# Patient Record
Sex: Male | Born: 2000 | Race: White | Hispanic: No | Marital: Single | Smoking: Never smoker
Health system: Southern US, Community
[De-identification: ages and names within clinical notes are randomized; demographics above are authoritative.]

## PROBLEM LIST (undated history)

## (undated) DIAGNOSIS — R569 Unspecified convulsions: Secondary | ICD-10-CM

## (undated) DIAGNOSIS — J329 Chronic sinusitis, unspecified: Secondary | ICD-10-CM

## (undated) DIAGNOSIS — Q9351 Angelman syndrome: Secondary | ICD-10-CM

## (undated) HISTORY — PX: TESTICLE SURGERY: SHX794

---
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
## 2022-08-30 DIAGNOSIS — G40319 Generalized idiopathic epilepsy and epileptic syndromes, intractable, without status epilepticus: Secondary | ICD-10-CM | POA: Insufficient documentation

## 2023-06-05 ENCOUNTER — Other Ambulatory Visit: Payer: Self-pay | Admitting: Pediatrics

## 2023-06-05 ENCOUNTER — Encounter: Payer: Self-pay | Admitting: Pediatrics

## 2023-06-05 DIAGNOSIS — F78A9 Other genetic related intellectual disability: Secondary | ICD-10-CM

## 2023-06-05 DIAGNOSIS — Z1589 Genetic susceptibility to other disease: Secondary | ICD-10-CM

## 2024-02-05 ENCOUNTER — Ambulatory Visit
Admission: EM | Admit: 2024-02-05 | Discharge: 2024-02-05 | Disposition: A | Discharge status: Home / Self Care | Payer: MEDICAID | Attending: Nurse Practitioner | Admitting: Nurse Practitioner

## 2024-02-05 ENCOUNTER — Encounter: Payer: Self-pay | Admitting: Emergency Medicine

## 2024-02-05 ENCOUNTER — Ambulatory Visit (INDEPENDENT_AMBULATORY_CARE_PROVIDER_SITE_OTHER): Payer: MEDICAID | Admitting: Radiology

## 2024-02-05 ENCOUNTER — Telehealth: Payer: Self-pay | Admitting: Emergency Medicine

## 2024-02-05 DIAGNOSIS — M79675 Pain in left toe(s): Secondary | ICD-10-CM

## 2024-02-05 NOTE — ED Triage Notes (Signed)
 Pt is disabled and presents with mother who states when he got back from school yesterday she notice his left was bruised and red.

## 2024-02-05 NOTE — ED Provider Notes (Signed)
 [facility]    CSN: [id number] Arrival date & time: 02/05/24  0955      History   Chief Complaint Chief Complaint  Patient presents with   Foot Pain    HPI Benjamin Beard is a 23 y.o. male.   Benjamin Beard is a 23 year old nonverbal male with a history of X-linked Angelman syndrome who presents with his mother for evaluation of pain, redness, and swelling to the left great toe. His mother reports first noticing the issue yesterday around 3:30 PM when Benjamin Beard returned home from school. Benjamin Beard attends [employer], a school that supports individuals with developmental and intellectual disabilities. According to his mother, Benjamin Beard has a habit of forcefully stomping his feet when Benjamin Beard walks and has sustained multiple foot injuries in the past as a result. She was informed by his teacher that his shoes were removed at school yesterday due to his stomping being disruptive. No other visible injuries were noted to the foot or other toes. His mother has not applied any treatment or given him any medication for pain at this time.  The following portions of the patient's history were reviewed and updated as appropriate: allergies, current medications, past family history, past medical history, past social history, past surgical history, and problem list.        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 Authorizing Provider  acetaminophen (TYLENOL) 160 MG/5ML elixir Take 15 mg/kg by mouth every 4 (four) hours as needed for fever.     [provider]  azelastine (ASTELIN) 0.1 % nasal spray Place 1 spray into both nostrils 2 (two) times daily. Use in each nostril as directed    [provider]  azithromycin  (ZITHROMAX ) 200 MG/5ML suspension Give him 6 ML's once, then 3 ML's once daily for 4 more days 06/17/16   Deis, Jamie, MD  Clobazam (ONFI) 10 MG TABS Take 20 mg by mouth See admin instructions. 20 mg every morning, 5mg  midday, 20mg  at night    [provider]  clonazePAM (KLONOPIN) 0.5 MG tablet Take 0.5 mg by mouth 2 (two) times daily as needed (seizures).     [provider]  cyproheptadine (PERIACTIN) 2 MG/5ML syrup Take 10 mg by mouth at bedtime.    [provider]  diazepam (DIASTAT ACUDIAL) 10 MG GEL Place 10 mg rectally once as needed for seizure (lasting more than 3 mins).     [provider]  ibuprofen  (ADVIL ,MOTRIN ) 100 MG/5ML suspension Take 10.1 mLs (202 mg total) by mouth every 6 (six) hours as needed for fever or mild pain. Patient not taking: Reported on 06/17/2016 11/29/13   Angeline Barefoot, MD  loratadine (CLARITIN REDITABS) 10 MG dissolvable tablet Take 10 mg by mouth daily as needed for allergies.    [provider]  ondansetron  (ZOFRAN  ODT) 4 MG disintegrating tablet Take 1 tablet (4 mg total) by mouth every 8 (eight) hours as needed for vomiting. 06/17/16   Sharlette Dayhoff, MD  Perampanel 2 MG TABS Take 2 mg by mouth See admin instructions. 4mg  at bedtime for 7 days, then 6mg  at bedtime for 7 days, then 8mg  at bedtime    [provider]  rufinamide (BANZEL) 200 MG tablet Take 600 mg by mouth 2 (two) times daily with a meal.    [provider]  topiramate (TOPAMAX SPRINKLE) 25 MG capsule Take 125 mg by mouth 2 (two) times daily.     [provider]    Family History History reviewed. No pertinent family history.  Social History Social History   Tobacco Use   Smoking status: Never   Smokeless tobacco: Never     Allergies    Patient has no known allergies.   Review of Systems Review of Systems  Unable to perform ROS: Patient nonverbal (limited due to nonverbal status - mom providing history)  Musculoskeletal:  Negative for gait problem.  Skin:  Positive for color change. Negative for wound.  All other systems reviewed and are negative.    Physical Exam Triage Vital Signs ED Triage Vitals [02/05/24 1124]  Encounter Vitals Group     BP 110/71     Systolic BP Percentile      Diastolic BP Percentile      Pulse Rate 65     Resp      Temp 97.9 F (36.6 C)     Temp Source Oral     SpO2 97 %     Weight      Height      Head Circumference      Peak Flow      Pain Score      Pain Loc      Pain Education      Exclude from Growth Chart    No data found.  Updated Vital Signs BP 110/71 (BP Location: Right Arm)   Pulse 65   Temp 97.9 F (36.6 C) (Oral)   SpO2 97%   Visual Acuity Right Eye Distance:   Left Eye Distance:   Bilateral Distance:    Right Eye Near:   Left Eye Near:    Bilateral Near:     Physical Exam Constitutional:      General: Benjamin Beard is awake. Benjamin Beard is not in acute distress.    Appearance: Normal appearance. Benjamin Beard is not ill-appearing or toxic-appearing.  HENT:     Head: Normocephalic.     Mouth/Throat:     Mouth: Mucous membranes are moist.  Eyes:     Conjunctiva/sclera: Conjunctivae normal.  Cardiovascular:     Rate and Rhythm: Normal rate and regular rhythm.     Heart sounds: Normal heart sounds.  Pulmonary:     Effort: Pulmonary effort is normal.     Breath sounds: Normal breath sounds.  Musculoskeletal:        General: Normal range of motion.     Cervical back: Normal range of motion and neck supple.     Left foot: Normal range of motion. No deformity. Normal pulse.       Feet:  Feet:     Left foot:     Skin integrity: Callus present.     Toenail Condition: Left toenails are normal.     Comments: Erythema and swelling noted limited to the left great toe  Skin:     General: Skin is warm and dry.  Neurological:     Mental Status: Benjamin Beard is alert. Mental status is at baseline.      UC Treatments /  Results  Labs (all labs ordered are listed, but only abnormal results are displayed) Labs Reviewed - No data to display  EKG   Radiology No results found.  Procedures Procedures (including critical care time)  Medications Ordered in UC Medications - No data to display  Initial Impression / Assessment and Plan / UC Course  I have reviewed the triage vital signs and the nursing notes.  Pertinent labs & imaging results that were available during my care of the patient were reviewed by me and considered in my medical decision making (see chart for details).    23 year old nonverbal male with a history of X-linked Angelman syndrome who presents with his mother for evaluation of pain, redness, and swelling to the left great toe. The patient is known to forcefully stomp his feet while walking, a behavior that has led to multiple foot injuries in the past. His teacher removed his shoes at school yesterday due to the disruptive nature of the stomping. On examination, the patient appears to be at his reported baseline. Physical exam shows erythema localized to the left great toe without any open wounds, skin breakdown, bruising, or drainage. The patient demonstrated a pain response when the area was palpated, indicating tenderness. An X-ray of the left great toe was obtained and is pending radiologist interpretation. Clinical impression is most consistent with a toe contusion, though a small fracture cannot be ruled out at this time. A postoperative shoe was applied in clinic for comfort and protection. Mom was advised to soak the foot in warm Epsom salt baths and use over-the-counter NSAIDs as needed for pain and inflammation. She was instructed to monitor the toe closely and to follow up with orthopedics if symptoms worsen or do not improve. She was informed that  she will only be contacted if the radiologist identifies an abnormality on the X-ray; otherwise, she may review the imaging report and today's visit summary through MyChart.  Today's evaluation has revealed no signs of a dangerous process. Discussed diagnosis with patient and/or guardian. Patient and/or guardian aware of their diagnosis, possible red flag symptoms to watch out for and need for close follow up. Patient and/or guardian understands verbal and written discharge instructions. Patient and/or guardian comfortable with plan and disposition.  Patient and/or guardian has a clear mental status at this time, good insight into illness (after discussion and teaching) and has clear judgment to make decisions regarding their care  Documentation was completed with the aid of voice recognition software. Transcription may contain typographical errors. Final Clinical Impressions(s) / UC Diagnoses   Final diagnoses:  Pain of left great toe     Discharge Instructions      Turon was seen today for pain in his left great toe, which is likely due to a contusion. X-rays were taken and are currently pending radiologist interpretation. You will be contacted if any abnormalities are found. Otherwise, you may view his results on MyChart once they are available. In the meantime, Travaughn was provided with a post-op shoe for support and comfort. You may give him over-the-counter NSAIDs such as ibuprofen  as needed for pain and swelling. Warm Epsom salt soaks may also help reduce discomfort. Please monitor the area closely. If Benjamin Beard develops increased pain, swelling, redness, bruising, or difficulty walking, the follow up with his primary care provider or an orthopedic specialist for further evaluation.     ED Prescriptions   None    PDMP not reviewed this encounter.   Maryruth Sol, [place] 02/05/24 1322

## 2024-02-05 NOTE — Discharge Instructions (Addendum)
 Benjamin Beard was seen today for pain in his left great toe, which is likely due to a contusion. X-rays were taken and are currently pending radiologist interpretation. You will be contacted if any abnormalities are found. Otherwise, you may view his results on MyChart once they are available. In the meantime, Campbell was provided with a post-op shoe for support and comfort. You may give him over-the-counter NSAIDs such as ibuprofen  as needed for pain and swelling. Warm Epsom salt soaks may also help reduce discomfort. Please monitor the area closely. If he develops increased pain, swelling, redness, bruising, or difficulty walking, the follow up with his primary care provider or an orthopedic specialist for further evaluation.

## 2024-05-30 ENCOUNTER — Ambulatory Visit
Admission: EM | Admit: 2024-05-30 | Discharge: 2024-05-30 | Disposition: A | Discharge status: Home / Self Care | Payer: MEDICAID

## 2024-05-30 DIAGNOSIS — S0101XA Laceration without foreign body of scalp, initial encounter: Secondary | ICD-10-CM

## 2024-05-30 MED ORDER — LIDOCAINE-EPINEPHRINE 1 %-1:100000 IJ SOLN: 20.0000 mL | Freq: Once | INTRAMUSCULAR | Status: DC

## 2024-05-30 NOTE — Discharge Instructions (Addendum)
  1. Occipital scalp laceration, initial encounter (Primary) - lidocaine -EPINEPHrine  (XYLOCAINE  W/EPI) 1 %-1:100000 (with pres) injection 20 mL - Laceration repair with surgical staples, 3 staples placed in occipital region of scalp, wound well-approximated, no secondary bleeding. -Bandage placed over site with direct pressure with Coban - Keep pressure bandage in place for 24 hours after which you may remove and reapply antibiotic ointment and fresh bandage. -Do not scrub the suture site as this could cause sutures to dislodge and reopen wound. -Continue to monitor for signs of infection such as increased pain, purulent drainage, increased redness, increased swelling, increased pain.  If you experience any escalation of symptoms follow-up for further evaluation management -Return to urgent care in 7 to 10 days for suture removal and wound reevaluation.

## 2024-05-30 NOTE — ED Triage Notes (Addendum)
 Per mom pt fell back and hit his head on the floor.  Laceration noted to back of head. Bleeding controlled.  Mom states she put liquid band aid on it. Pt is nonverbal.  Not able to tolerate VS.

## 2024-05-30 NOTE — ED Provider Notes (Signed)
 [facility]  Note:  This document was prepared using Dragon voice recognition software and may include unintentional dictation errors.  MRN: [id number] DOB: 2001-03-18  Subjective:   Benjamin Beard is a 23 y.o. male presenting for laceration to the posterior scalp following a fall that occurred at home.  Mother reports that father was caring for patient when he reared his head back hitting the floor causing laceration.  Severe bleeding noted at the time of injury, bleeding well-controlled with direct pressure with gauze.  No loss of consciousness, no change in activity level, patient is developmentally delayed but acting his normal demeanor.  Mother denies any other medical concern at this time   Current Facility-Administered Medications:    lidocaine -EPINEPHrine  (XYLOCAINE  W/EPI) 1 %-1:100000 (with pres) injection 20 mL, 20 mL, Other, Once, Laurencia Roma B, NP  Current Outpatient Medications:    acetaminophen (TYLENOL) 160 MG/5ML elixir, Take 15 mg/kg by mouth every 4 (four) hours as needed for fever., Disp: , Rfl:    azelastine (ASTELIN) 0.1 % nasal spray, Place 1 spray into both nostrils 2 (two) times daily. Use in each nostril as directed, Disp: , Rfl:    azithromycin  (ZITHROMAX ) 200 MG/5ML suspension, Give him 6 ML's once, then 3 ML's once daily for 4 more days, Disp: 22.5 mL, Rfl: 0   Clobazam (ONFI) 10 MG TABS, Take 20 mg by mouth See admin instructions. 20 mg every morning, 5mg  midday, 20mg  at night, Disp: , Rfl:    clonazePAM (KLONOPIN) 0.5 MG tablet, Take 0.5 mg by mouth 2 (two) times daily as needed (seizures). , Disp: , Rfl:    cyproheptadine (PERIACTIN) 2 MG/5ML syrup, Take 10 mg by mouth at bedtime., Disp: , Rfl:    diazepam (DIASTAT ACUDIAL) 10 MG GEL, Place 10 mg rectally once as needed for seizure (lasting more than 3 mins). , Disp: , Rfl:    ibuprofen  (ADVIL ,MOTRIN ) 100 MG/5ML suspension, Take 10.1 mLs (202 mg total) by mouth every 6 (six) hours  as needed for fever or mild pain. (Patient not taking: Reported on 06/17/2016), Disp: 237 mL, Rfl: 0   loratadine (CLARITIN REDITABS) 10 MG dissolvable tablet, Take 10 mg by mouth daily as needed for allergies., Disp: , Rfl:    ondansetron  (ZOFRAN  ODT) 4 MG disintegrating tablet, Take 1 tablet (4 mg total) by mouth every 8 (eight) hours as needed for vomiting., Disp: 12 tablet, Rfl: 0   Perampanel 2 MG TABS, Take 2 mg by mouth See admin instructions. 4mg  at bedtime for 7 days, then 6mg  at bedtime for 7 days, then 8mg  at bedtime, Disp: , Rfl:    rufinamide (BANZEL) 200 MG tablet, Take 600 mg by mouth 2 (two) times daily with a meal., Disp: , Rfl:    topiramate (TOPAMAX SPRINKLE) 25 MG capsule, Take 125 mg by mouth 2 (two) times daily. , Disp: , Rfl:    No Known Allergies  Past Medical History:  Diagnosis Date   Angelman syndrome    X-linked Angleman Syndrome   Seizures (HCC)    Sinus infection      Past Surgical History:  Procedure Laterality Date   TESTICLE SURGERY      History reviewed. No pertinent family history.  Social History   Tobacco Use   Smoking status: Never   Smokeless tobacco: Never    ROS Refer to HPI for ROS details.  Objective:    Vitals: Pulse 87   Temp 99.2 F (37.3 C) (Oral)   Resp 18  Physical Exam Vitals and nursing note reviewed.  Constitutional:      General: He is not in acute distress.    Appearance: He is well-developed. He is not ill-appearing or toxic-appearing.  HENT:     Head: Normocephalic.  Cardiovascular:     Rate and Rhythm: Normal rate.  Pulmonary:     Effort: Pulmonary effort is normal. No respiratory distress.  Skin:    General: Skin is warm and dry.     Findings: Laceration present.      Neurological:     General: No focal deficit present.     Mental Status: He is alert and oriented to person, place, and time.  Psychiatric:        Mood and Affect: Mood normal.        Behavior: Behavior normal.     Laceration  Repair  Date/Time: 05/30/2024 5:27 PM  Performed by: Aurea Ethel NOVAK, NP Authorized by: Aurea Ethel NOVAK, NP   Consent:    Consent obtained:  Verbal   Consent given by:  Parent   Risks discussed:  Infection and pain Universal protocol:    Patient identity confirmed:  Arm band Anesthesia:    Anesthesia method:  Topical application Laceration details:    Location:  Scalp   Scalp location:  Occipital   Length (cm):  1.5   Depth (mm):  5 Treatment:    Area cleansed with:  Shur-Clens Skin repair:    Repair method:  Staples   Number of staples:  3 Approximation:    Approximation:  Close Repair type:    Repair type:  Simple Post-procedure details:    Dressing:  Antibiotic ointment and non-adherent dressing   Procedure completion:  Tolerated   No results found for this or any previous visit (from the past 24 hours).  Assessment and Plan :     Discharge Instructions       1. Occipital scalp laceration, initial encounter (Primary) - lidocaine -EPINEPHrine  (XYLOCAINE  W/EPI) 1 %-1:100000 (with pres) injection 20 mL - Laceration repair with surgical staples, 3 staples placed in occipital region of scalp, wound well-approximated, no secondary bleeding. -Bandage placed over site with direct pressure with Coban - Keep pressure bandage in place for 24 hours after which you may remove and reapply antibiotic ointment and fresh bandage. -Do not scrub the suture site as this could cause sutures to dislodge and reopen wound. -Continue to monitor for signs of infection such as increased pain, purulent drainage, increased redness, increased swelling, increased pain.  If you experience any escalation of symptoms follow-up for further evaluation management -Return to urgent care in 7 to 10 days for suture removal and wound reevaluation.       Benjamin Beard   Donatello Kleve, [place] B, [place] 05/30/24 1733

## 2024-06-06 ENCOUNTER — Other Ambulatory Visit: Payer: Self-pay

## 2024-06-06 ENCOUNTER — Ambulatory Visit
Admission: EM | Admit: 2024-06-06 | Discharge: 2024-06-06 | Disposition: A | Discharge status: Home / Self Care | Payer: MEDICAID | Attending: Physician Assistant | Admitting: Physician Assistant

## 2024-06-06 VITALS — BP 118/63 | HR 90 | Temp 97.5°F | Resp 18 | Ht 62.0 in | Wt 100.0 lb

## 2024-06-06 DIAGNOSIS — Z4802 Encounter for removal of sutures: Secondary | ICD-10-CM

## 2024-06-06 NOTE — ED Triage Notes (Signed)
 Pt presents with two caregivers today for staple removal. Three staples were placed on 8/15 to posterior head. Denies any complications. Unable to obtain oxygen saturation. Pt is nonverbal. Very restless in triage room.

## 2024-06-06 NOTE — ED Notes (Addendum)
 Pt's laceration appears to be well-healed. One out of the three staples difficult to remove due to scab forming over the wound. Minimal bleeding after removal. Would like a provider to assess. PA will be made aware. Provider visit.

## 2024-06-06 NOTE — ED Provider Notes (Deleted)
 Reviewed laceration site after staple removal by Rosina Sharps, RN. Laceration appears scabbed but well healed. No signs of active bleeding or severe tenderness, swelling, surrounding erythema.    Benjamin Rocky BRAVO, PA-C 06/06/24 1058    Benjamin Beard, Rocky BRAVO, PA-C 06/06/24 1159

## 2024-06-06 NOTE — ED Provider Notes (Signed)
 [facility]    CSN: [id number] Arrival date & time: 06/06/24  1008      History   Chief Complaint Chief Complaint  Patient presents with   Suture / Staple Removal    HPI Benjamin Beard is a 23 y.o. male.   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 Authorizing Provider  acetaminophen (TYLENOL) 160 MG/5ML elixir Take 15 mg/kg by mouth every 4 (four) hours as needed for fever.    [provider]  azelastine (ASTELIN) 0.1 % nasal spray Place 1 spray into both nostrils 2 (two) times daily. Use in each nostril as directed    [provider]  azithromycin  (ZITHROMAX ) 200 MG/5ML suspension Give him 6 ML's once, then 3 ML's once daily for 4 more days 06/17/16   Deis, Jamie, MD  Clobazam (ONFI) 10 MG TABS Take 20 mg by mouth See admin instructions. 20 mg every morning, 5mg  midday, 20mg  at night    [provider]  clonazePAM (KLONOPIN) 0.5 MG tablet Take 0.5 mg by mouth 2 (two) times daily as needed (seizures).     [provider]  cyproheptadine (PERIACTIN) 2 MG/5ML syrup Take 10 mg by mouth at bedtime.    [provider]  diazepam (DIASTAT ACUDIAL) 10 MG GEL Place 10 mg rectally once as needed for seizure (lasting more than 3 mins).     [provider]  ibuprofen   (ADVIL ,MOTRIN ) 100 MG/5ML suspension Take 10.1 mLs (202 mg total) by mouth every 6 (six) hours as needed for fever or mild pain. Patient not taking: Reported on 06/17/2016 11/29/13   Rhae Lye, MD  loratadine (CLARITIN REDITABS) 10 MG dissolvable tablet Take 10 mg by mouth daily as needed for allergies.    [provider]  ondansetron  (ZOFRAN  ODT) 4 MG disintegrating tablet Take 1 tablet (4 mg total) by mouth every 8 (eight) hours as needed for vomiting. 06/17/16   Deis, Jamie, MD  Perampanel 2 MG TABS Take 2 mg by mouth See admin instructions. 4mg  at bedtime for 7 days, then 6mg  at bedtime for 7 days, then 8mg  at bedtime    [provider]  rufinamide (BANZEL) 200 MG tablet Take 600 mg by mouth 2 (two) times daily with a meal.    [provider]  topiramate (TOPAMAX SPRINKLE) 25 MG capsule Take 125 mg by mouth 2 (two) times daily.     [provider]    Family History History reviewed. No pertinent family history.  Social History Social History   Tobacco Use   Smoking status: Never   Smokeless tobacco: Never     Allergies   Patient has no known allergies.   Review of Systems Review of Systems  Skin:        Laceration to  scalp     Physical Exam Triage Vital Signs ED Triage Vitals  Encounter Vitals Group     BP 06/06/24 1019 118/63     Girls Systolic BP Percentile --      Girls Diastolic BP Percentile --      Boys Systolic BP Percentile --      Boys Diastolic BP Percentile --      Pulse Rate 06/06/24 1019 90     Resp 06/06/24 1019 18     Temp 06/06/24 1033 (!) 97.5 F (36.4 C)     Temp Source 06/06/24 1033 Temporal     SpO2 --      Weight 06/06/24 1019 100 lb 1.4 oz (45.4 kg)     Height 06/06/24 1019 5' 2 (1.575 m)     Head Circumference --      Peak Flow --      Pain Score --      Pain Loc --      Pain Education --      Exclude from Growth Chart --    No data found.  Updated Vital Signs BP 118/63 (BP Location: Right Arm)    Pulse 90   Temp (!) 97.5 F (36.4 C) (Temporal)   Resp 18   Ht 5' 2 (1.575 m)   Wt 100 lb (45.4 kg)   BMI 18.29 kg/m   Visual Acuity Right Eye Distance:   Left Eye Distance:   Bilateral Distance:    Right Eye Near:   Left Eye Near:    Bilateral Near:     Physical Exam Vitals reviewed.  Constitutional:      General: He is awake.     Appearance: Normal appearance. He is well-developed and well-groomed.  HENT:     Head: Normocephalic and atraumatic.   Eyes:     Extraocular Movements: Extraocular movements intact.     Conjunctiva/sclera: Conjunctivae normal.  Pulmonary:     Effort: Pulmonary effort is normal.  Musculoskeletal:     Cervical back: Normal range of motion.  Neurological:     Mental Status: He is alert and oriented to person, place, and time.  Psychiatric:        Attention and Perception: Attention normal.        Mood and Affect: Mood normal.        Speech: Speech normal.        Behavior: Behavior normal. Behavior is cooperative.      UC Treatments / Results  Labs (all labs ordered are listed, but only abnormal results are displayed) Labs Reviewed - No data to display  EKG   Radiology No results found.  Procedures Procedures (including critical care time)  Medications Ordered in UC Medications - No data to display  Initial Impression / Assessment and Plan / UC Course  I have reviewed the triage vital signs and the nursing notes.  Pertinent labs & imaging results that were available during my care of the patient were reviewed by me and considered in my medical decision making (see chart for details).      Final Clinical Impressions(s) / UC Diagnoses   Final diagnoses:  Encounter for removal of staples    Patient presents today for staple removal.  Patient was seen at this urgent care on 05/30/2024 for an occipital scalp laceration which was repaired with 3 staples.  Staples were removed in their entirety by Rosina Sharps, RN.  She  then reported to me that there was some bleeding and  scab disruption during procedure and would like area to be evaluated by provider.  Laceration appears to be healing well and there are no obvious signs of active bleeding, erythema, swelling, purulent drainage on my exam.  Reviewed home care measures with patient's caregivers.  Follow-up as needed   Discharge Instructions   None    ED Prescriptions   None    PDMP not reviewed this encounter.   Marylene Rocky BRAVO, PA-C 06/06/24 1203

## 2024-06-12 ENCOUNTER — Other Ambulatory Visit: Payer: Self-pay

## 2024-06-12 ENCOUNTER — Ambulatory Visit
Admission: EM | Admit: 2024-06-12 | Discharge: 2024-06-12 | Disposition: A | Discharge status: Home / Self Care | Payer: MEDICAID

## 2024-06-12 DIAGNOSIS — S0191XD Laceration without foreign body of unspecified part of head, subsequent encounter: Secondary | ICD-10-CM

## 2024-06-12 NOTE — ED Provider Notes (Signed)
 [facility]    CSN: [id number] Arrival date & time: 06/12/24  1547      History   Chief Complaint Chief Complaint  Patient presents with   Head Injury    HPI Zeus JOHNNIE GOYNES is a 23 y.o. male.  has a past medical history of Angelman syndrome, Seizures (HCC), and Sinus infection.   HPI  Pt is here with his mother who is providing HPI She states that patient sustained a head injury while at day program - staff are uncertain of mechanism as patient is nonverbal and the incident was not witnessed. They think he hit the back of his head on the fence- Unsure if lost consciousness and mother states he is at his baseline for this time of day. She reports he is usually tired after school on a normal day.  She reports that he has not indicated that he is in pain.  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 Authorizing Provider  cholecalciferol (VITAMIN D3) 25 MCG (1000 UNIT) tablet Take 1,000 Units by mouth. 05/09/23  Yes [provider]  levETIRAcetam (KEPPRA) 750 MG tablet Take by mouth. 12/12/22  Yes [provider]  NAYZILAM 5 MG/0.1ML SOLN Place 5 mg into the nose. 05/22/22  Yes [provider]  acetaminophen (TYLENOL) 160 MG/5ML elixir Take 15 mg/kg by mouth every 4 (four) hours as needed for fever.    [provider]  azelastine (ASTELIN) 0.1 % nasal spray Place 1 spray into both nostrils 2 (two) times daily. Use in  each nostril as directed    [provider]  azithromycin  (ZITHROMAX ) 200 MG/5ML suspension Give him 6 ML's once, then 3 ML's once daily for 4 more days 06/17/16   Deis, Jamie, MD  Clobazam (ONFI) 10 MG TABS Take 20 mg by mouth See admin instructions. 20 mg every morning, 5mg  midday, 20mg  at night    [provider]  clonazePAM (KLONOPIN) 0.5 MG tablet Take 0.5 mg by mouth 2 (two) times daily as needed (seizures).     [provider]  cyproheptadine (PERIACTIN) 2 MG/5ML syrup Take 10 mg by mouth at bedtime.    [provider]  diazepam (DIASTAT ACUDIAL) 10 MG GEL Place 10 mg rectally once as needed for seizure (lasting more than 3 mins).     [provider]  gabapentin (NEURONTIN) 300 MG capsule Take 300 mg by mouth daily.    [provider]  ibuprofen  (ADVIL ,MOTRIN ) 100 MG/5ML suspension Take 10.1 mLs (202 mg total) by mouth every 6 (six) hours as needed for fever or mild pain. Patient not taking: Reported on 06/17/2016 11/29/13   Rhae Lye, MD  loratadine (CLARITIN REDITABS) 10 MG dissolvable tablet Take 10 mg by mouth daily as needed for allergies.    [provider]  ondansetron  (ZOFRAN  ODT) 4 MG disintegrating tablet Take 1 tablet (4 mg  total) by mouth every 8 (eight) hours as needed for vomiting. 06/17/16   Deis, Jamie, MD  Perampanel 2 MG TABS Take 2 mg by mouth See admin instructions. 4mg  at bedtime for 7 days, then 6mg  at bedtime for 7 days, then 8mg  at bedtime    [provider]  rufinamide (BANZEL) 200 MG tablet Take 600 mg by mouth 2 (two) times daily with a meal.    [provider]  topiramate (TOPAMAX SPRINKLE) 25 MG capsule Take 125 mg by mouth 2 (two) times daily.     [provider]    Family History History reviewed. No pertinent family history.  Social History Social History   Tobacco Use   Smoking status: Never   Smokeless tobacco: Never  Vaping Use   Vaping status: Never Used   Substance Use Topics   Alcohol use: Never   Drug use: Never     Allergies   Patient has no known allergies.   Review of Systems Review of Systems  Skin:  Positive for wound.     Physical Exam Triage Vital Signs ED Triage Vitals [06/12/24 1604]  Encounter Vitals Group     BP 105/66     Girls Systolic BP Percentile      Girls Diastolic BP Percentile      Boys Systolic BP Percentile      Boys Diastolic BP Percentile      Pulse Rate (!) 101     Resp 16     Temp 98.8 F (37.1 C)     Temp Source Temporal     SpO2 97 %     Weight 97 lb (44 kg)     Height 5' 3 (1.6 m)     Head Circumference      Peak Flow      Pain Score      Pain Loc      Pain Education      Exclude from Growth Chart    No data found.  Updated Vital Signs BP 105/66 (BP Location: Right Arm)   Pulse (!) 101   Temp 98.8 F (37.1 C) (Temporal)   Resp 16   Ht 5' 3 (1.6 m)   Wt 97 lb (44 kg)   SpO2 97%   BMI 17.18 kg/m   Visual Acuity Right Eye Distance:   Left Eye Distance:   Bilateral Distance:    Right Eye Near:   Left Eye Near:    Bilateral Near:     Physical Exam Vitals reviewed.  Constitutional:      General: He is awake.     Appearance: Normal appearance. He is well-developed and well-groomed.  HENT:     Head: Microcephalic. Abrasion present.   Eyes:     Conjunctiva/sclera: Conjunctivae normal.  Pulmonary:     Effort: Pulmonary effort is normal.  Neurological:     Mental Status: He is alert. Mental status is at baseline.  Psychiatric:        Speech: He is noncommunicative.      UC Treatments / Results  Labs (all labs ordered are listed, but only abnormal results are displayed) Labs Reviewed - No data to display  EKG   Radiology No results found.  Procedures Procedures (including critical care time)  Medications Ordered in UC Medications - No data to display  Initial Impression / Assessment and Plan / UC Course  I have reviewed the triage vital signs  and the nursing notes.  Pertinent labs & imaging  results that were available during my care of the patient were reviewed by me and considered in my medical decision making (see chart for details).      Final Clinical Impressions(s) / UC Diagnoses   Final diagnoses:  Laceration of head without foreign body, unspecified part of head, subsequent encounter   Patient is here with his mother who is providing HPI.  His mother states that while patient was at day program and waiting for bus he had an unwitnessed fall against a metal fence which caused bleeding along the back of the head.  Physical exam is notable for an abrasion to the back of the head over pre-existing laceration site.  At this time I suspect the patient has simply pulled the scab off from his laceration site which was stapled on 05/30/2024.  Patient's mother states that he is at his typical baseline for this time of day and has not experienced steady vomiting, significant behavior changes, loss of consciousness.  At this time I do not recommend further closure of the site.  Reviewed home measures with patient's mother for keeping area clean and dry to assist with further resolution.  ED and return precautions also reviewed and provided in AVS.  Follow-up as needed.    Discharge Instructions      Abad was seen today for concerns of a head injury that was sustained at school.  After examining the area I think that he he has dislodged the scab from his previous laceration.  It does not appear that we need to do any laceration repair today.  Please continue with home measures such as keeping the area clean with warm water and gentle soap and patting dry.  I recommend applying a small amount of Aquaphor or Vaseline to keep it moist so that it does not itch. Since he did hit his head I do recommend monitoring him for signs of activity change.  If he starts having vomiting, notable confusion or agitation, loss of consciousness, significant  changes from his typical baseline please go to the emergency room for evaluation with advanced imaging to make sure that he has not sustained an internal head injury.     ED Prescriptions   None    PDMP not reviewed this encounter.   Marylene Rocky BRAVO, PA-C 06/12/24 1732

## 2024-06-12 NOTE — ED Triage Notes (Signed)
 Pt brought in by caregiver today (mother). Pt presents with head injury today that occurred at approximately 3:10 PM. Mother was called by the school at this time. School staff states pt was outside waiting for the bus, may have hit his head on a metal fence. Unclear as it was unwitnessed. Pt is calm in triage room. Nonverbal. Bleeding is controlled. Guaze + coban applied to area PTA.

## 2024-06-12 NOTE — Discharge Instructions (Signed)
 Benjamin Beard was seen today for concerns of a head injury that was sustained at school.  After examining the area I think that he he has dislodged the scab from his previous laceration.  It does not appear that we need to do any laceration repair today.  Please continue with home measures such as keeping the area clean with warm water and gentle soap and patting dry.  I recommend applying a small amount of Aquaphor or Vaseline to keep it moist so that it does not itch. Since he did hit his head I do recommend monitoring him for signs of activity change.  If he starts having vomiting, notable confusion or agitation, loss of consciousness, significant changes from his typical baseline please go to the emergency room for evaluation with advanced imaging to make sure that he has not sustained an internal head injury.

## 2024-06-24 ENCOUNTER — Ambulatory Visit: Payer: MEDICAID | Admitting: Radiology

## 2024-06-24 ENCOUNTER — Ambulatory Visit
Admission: RE | Admit: 2024-06-24 | Discharge: 2024-06-24 | Disposition: A | Discharge status: Home / Self Care | Payer: MEDICAID | Source: Ambulatory Visit | Attending: Emergency Medicine | Admitting: Emergency Medicine

## 2024-06-24 VITALS — BP 108/77 | HR 97 | Temp 98.3°F | Resp 16

## 2024-06-24 DIAGNOSIS — R2689 Other abnormalities of gait and mobility: Secondary | ICD-10-CM

## 2024-06-24 NOTE — Discharge Instructions (Addendum)
 We were unable to xray tib/fib due to pt handicap and inability to perform imaging. Discussed with parents the difficultness of assessment as patient is nonverbal due to Angelman syndrome, will attempt x-ray of extremities(bilateral tib-fib/foot,unsuccessful with imaging of tib/fib due to reported injury and limping per dad. Ankle xrays are both negative. Recommend further evaluation in ER.

## 2024-06-24 NOTE — ED Provider Notes (Signed)
 [facility]    CSN: [id number] Arrival date & time: 06/24/24  1823      History   Chief Complaint Chief Complaint  Patient presents with   Leg Pain    HPI Benjamin Beard is a 23 y.o. male.   23 year old male pt, Benjamin Beard, presents to urgent care with parents who state patient ? fell last Wednesday at school.  Dad states that pt was limping going to the bus today,states patient having difficulty applying pressure to both legs and only put some weight on his legs.  No obvious deformity or swelling to this provider. Pt actively stemming in office,rubbing shins together and flexing/extending legs  Patient has Angelman syndrome and is nonverbal  The history is provided by the patient. No language interpreter was used.    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 Authorizing Provider  acetaminophen (TYLENOL) 160 MG/5ML elixir Take 15 mg/kg by mouth every 4 (four) hours as needed for fever.    [provider]  azelastine (ASTELIN) 0.1 % nasal spray Place 1 spray into both nostrils 2 (two) times daily. Use in each nostril as directed    [provider]  azithromycin  (ZITHROMAX ) 200 MG/5ML suspension Give him 6 ML's once, then 3 ML's once daily for 4 more days 06/17/16   Deis, Jamie, MD  cholecalciferol (VITAMIN D3) 25 MCG (1000 UNIT) tablet Take 1,000 Units by mouth. 05/09/23   [provider]  Clobazam (ONFI) 10 MG TABS Take 20 mg by mouth See admin instructions. 20 mg every morning, 5mg   midday, 20mg  at night    [provider]  clonazePAM (KLONOPIN) 0.5 MG tablet Take 0.5 mg by mouth 2 (two) times daily as needed (seizures).     [provider]  cyproheptadine (PERIACTIN) 2 MG/5ML syrup Take 10 mg by mouth at bedtime.    [provider]  diazepam (DIASTAT ACUDIAL) 10 MG GEL Place 10 mg rectally once as needed for seizure (lasting more than 3 mins).     [provider]  gabapentin (NEURONTIN) 300 MG capsule Take 300 mg by mouth daily.    [provider]  ibuprofen  (ADVIL ,MOTRIN ) 100 MG/5ML suspension Take 10.1 mLs (202 mg total) by mouth every 6 (six) hours as needed for fever or mild pain. Patient not taking: Reported on 06/17/2016 11/29/13   Rhae Lye, MD  levETIRAcetam (KEPPRA) 750 MG tablet Take by mouth. 12/12/22   [provider]  loratadine (CLARITIN REDITABS) 10 MG dissolvable tablet Take 10 mg by mouth daily as needed for allergies.    [provider]  NAYZILAM 5 MG/0.1ML SOLN Place 5 mg into the nose. 05/22/22   [provider]  ondansetron  (ZOFRAN  ODT) 4 MG disintegrating tablet Take 1 tablet (4 mg total) by mouth every 8 (eight) hours as needed for vomiting. 06/17/16   Deis, Jamie, MD  Perampanel 2 MG TABS Take 2 mg by mouth See admin instructions. 4mg  at  bedtime for 7 days, then 6mg  at bedtime for 7 days, then 8mg  at bedtime    [provider]  rufinamide (BANZEL) 200 MG tablet Take 600 mg by mouth 2 (two) times daily with a meal.    [provider]  topiramate (TOPAMAX SPRINKLE) 25 MG capsule Take 125 mg by mouth 2 (two) times daily.     [provider]    Family History History reviewed. No pertinent family history.  Social History Social History   Tobacco Use   Smoking status: Never   Smokeless tobacco: Never  Vaping Use   Vaping status: Never Used  Substance Use Topics   Alcohol use: Never   Drug use: Never     Allergies   Patient has no known  allergies.   Review of Systems Review of Systems  Constitutional:  Negative for fever.  Musculoskeletal:  Positive for gait problem.  Skin: Negative.   All other systems reviewed and are negative.    Physical Exam Triage Vital Signs ED Triage Vitals  Encounter Vitals Group     BP      Girls Systolic BP Percentile      Girls Diastolic BP Percentile      Boys Systolic BP Percentile      Boys Diastolic BP Percentile      Pulse      Resp      Temp      Temp src      SpO2      Weight      Height      Head Circumference      Peak Flow      Pain Score      Pain Loc      Pain Education      Exclude from Growth Chart    No data found.  Updated Vital Signs BP 108/77 (BP Location: Left Arm)   Pulse 97   Temp 98.3 F (36.8 C) (Temporal)   Resp 16   SpO2 97%   Visual Acuity Right Eye Distance:   Left Eye Distance:   Bilateral Distance:    Right Eye Near:   Left Eye Near:    Bilateral Near:     Physical Exam Vitals and nursing note reviewed.  Constitutional:      Appearance: He is well-groomed. He is not ill-appearing, toxic-appearing or diaphoretic.  Cardiovascular:     Rate and Rhythm: Normal rate.     Pulses:          Dorsalis pedis pulses are 2+ on the right side and 2+ on the left side.  Pulmonary:     Effort: Pulmonary effort is normal.  Musculoskeletal:     Comments: Patient has full range of motion, parents report bilateral knee swelling with possible redness to left shin, no appreciable swelling or redness noted by this provider, patient rubs his legs up against each other, no open wound no purulent drainage patient is afebrile nontoxic-appearing, nonverbal, patient is not grimacing with exam or withdrawing extremities, DP +2 bilaterally  Skin:    Findings: No erythema or wound.  Neurological:     Mental Status: He is alert.      UC Treatments / Results  Labs (all labs ordered are listed, but only abnormal results are displayed) Labs Reviewed -  No data to display  EKG   Radiology DG Foot Complete Left Result Date: 06/24/2024 CLINICAL DATA:  Limping. EXAM: LEFT FOOT - COMPLETE 3+ VIEW; RIGHT FOOT COMPLETE -  3+ VIEW COMPARISON:  Left great toe radiograph dated 02/05/2024 FINDINGS: No acute fracture or dislocation. The bones are well mineralized. No arthritic changes. The soft tissues are unremarkable. IMPRESSION: Negative. Electronically Signed   By: Vanetta Chou M.D.   On: 06/24/2024 19:54   DG Foot Complete Right Result Date: 06/24/2024 CLINICAL DATA:  Limping. EXAM: LEFT FOOT - COMPLETE 3+ VIEW; RIGHT FOOT COMPLETE - 3+ VIEW COMPARISON:  Left great toe radiograph dated 02/05/2024 FINDINGS: No acute fracture or dislocation. The bones are well mineralized. No arthritic changes. The soft tissues are unremarkable. IMPRESSION: Negative. Electronically Signed   By: Vanetta Chou M.D.   On: 06/24/2024 19:54    Procedures Procedures (including critical care time)  Medications Ordered in UC Medications - No data to display  Initial Impression / Assessment and Plan / UC Course  I have reviewed the triage vital signs and the nursing notes.  Pertinent labs & imaging results that were available during my care of the patient were reviewed by me and considered in my medical decision making (see chart for details).    Discussed with parents the difficultness of assessment as patient is nonverbal due to Angelman syndrome, will attempt x-ray of  extremities(bilateral tib-fib/foot) due to reported injury and limping per dad, parents are aware they will need to check MyChart for results as they will not be resulted before closure of this office: recommend parents follow-up with ER as we were unable to obtain tib/ fib xrays and they have concern for injury: both parents verbalized understanding to this provider.    Ddx: Limping, fracture Final Clinical Impressions(s) / UC Diagnoses   Final diagnoses:  Limping     Discharge Instructions       We were unable to xray tib/fib due to pt handicap and inability to perform imaging. Discussed with parents the difficultness of assessment as patient is nonverbal due to Angelman syndrome, will attempt x-ray of extremities(bilateral tib-fib/foot,unsuccessful with imaging of tib/fib due to reported injury and limping per dad. Ankle xrays are both negative. Recommend further evaluation in ER.     ED Prescriptions   None    PDMP not reviewed this encounter.   Aminta Loose, NP 06/24/24 2015

## 2024-06-24 NOTE — ED Triage Notes (Signed)
 Pt presents with parents who states he fell last Wednesday. Dad notice today he was limping going to bus. Left ankle and knee swollen with red area on left shin. Pt not putting any weight on leg today.

## 2024-09-23 ENCOUNTER — Ambulatory Visit
Admission: RE | Admit: 2024-09-23 | Discharge: 2024-09-23 | Disposition: A | Discharge status: Home / Self Care | Payer: MEDICAID

## 2024-09-23 VITALS — BP 104/68 | HR 77 | Temp 97.8°F | Resp 18

## 2024-09-23 DIAGNOSIS — R0989 Other specified symptoms and signs involving the circulatory and respiratory systems: Secondary | ICD-10-CM

## 2024-09-23 NOTE — Discharge Instructions (Signed)
 VISIT SUMMARY:  You came in today with a fever, cough, and nasal discharge. You have a history of frequent sinus infections and are currently taking several medications for seizures. Your symptoms started a few days ago and have been concerning, especially since you slept most of the day yesterday, which is unusual for you.  YOUR PLAN:  -ACUTE UPPER RESPIRATORY INFECTION: An acute upper respiratory infection is a viral infection that affects your nose, throat, and airways. You have been experiencing fever, yellow nasal discharge, and a deep cough. We recommend taking Flonase for nasal congestion, and doing sinus flushes to clear your nasal passages. For your cough, continue using Robitussin. For fever control, keep alternating Tylenol with ibuprofen . Be cautious with these medications due to your seizure medications.  INSTRUCTIONS:  Please follow up if your symptoms do not improve or if they worsen. Continue to monitor your fever and stay hydrated. If you have any concerns or experience any new symptoms, contact our office.

## 2024-09-23 NOTE — ED Triage Notes (Addendum)
 He has had a fever up to 102 since Friday or Saturday. He has also had nasal congestion (yellow) and chest congestion. They have tried Robitussin, Zarbee's, Tylenol, and Ibuprofen .

## 2024-09-23 NOTE — ED Provider Notes (Signed)
 [facility]    CSN: [id number] Arrival date & time: 09/23/24  1122      History   Chief Complaint Chief Complaint  Patient presents with   Fever    Not sure if more is going on because he is nonverbal. - Entered by patient   Nasal Congestion    HPI Benjamin Beard is a 23 y.o. male.  has a past medical history of Angelman syndrome, Seizures (HCC), and Sinus infection.   HPI  Discussed the use of AI scribe software for clinical note transcription with the patient, who gave verbal consent to proceed.      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 Authorizing Provider  azelastine (ASTELIN) 0.1 % nasal spray Place 1 spray into both nostrils 2 (two) times daily. Use in each nostril as directed Patient taking differently: Place 1 spray into both nostrils daily as needed. Use in each nostril as directed   Yes [provider]  Clobazam (ONFI) 10 MG TABS Take 20 mg by mouth See admin instructions. 20 mg every morning, 5mg  midday, 20mg  at night   Yes [provider]  clonazePAM (KLONOPIN) 0.5 MG tablet Take 0.5 mg by mouth 2 (two) times daily as needed  (seizures).    Yes [provider]  cloNIDine (CATAPRES) 0.1 MG tablet Take 0.1 mg by mouth. 05/18/20  Yes [provider]  cyproheptadine (PERIACTIN) 2 MG/5ML syrup Take 10 mg by mouth at bedtime.   Yes [provider]  gabapentin (NEURONTIN) 300 MG capsule Take 300 mg by mouth daily.   Yes [provider]  levETIRAcetam (KEPPRA) 750 MG tablet Take by mouth. 12/12/22  Yes [provider]  loratadine (CLARITIN REDITABS) 10 MG dissolvable tablet Take 10 mg by mouth daily as needed for allergies.   Yes [provider]  NAYZILAM 5 MG/0.1ML SOLN Place 5 mg into the nose. 05/22/22  Yes [provider]  ondansetron  (ZOFRAN  ODT) 4 MG disintegrating tablet Take 1 tablet (4 mg total) by mouth every 8 (eight) hours as needed for vomiting. 06/17/16  Yes Deis, Jamie, MD  Perampanel 2 MG TABS Take 2 mg by mouth See admin instructions. 4mg  at bedtime for 7 days, then 6mg  at bedtime for 7 days, then 8mg  at bedtime Patient taking differently: Take 2 mg by mouth daily as needed. 4mg  at bedtime for 7 days, then 6mg  at bedtime for 7 days, then 8mg  at bedtime   Yes [provider]  rufinamide (BANZEL) 200 MG tablet Take 600 mg by mouth 2 (two) times daily with a meal.   Yes [provider]  topiramate (TOPAMAX SPRINKLE) 25  MG capsule Take 125 mg by mouth 2 (two) times daily.    Yes [provider]  acetaminophen (TYLENOL) 160 MG/5ML elixir Take 15 mg/kg by mouth every 4 (four) hours as needed for fever. Patient not taking: Reported on 09/23/2024    [provider]  azithromycin  (ZITHROMAX ) 200 MG/5ML suspension Give him 6 ML's once, then 3 ML's once daily for 4 more days Patient not taking: Reported on 09/23/2024 06/17/16   Deis, Jamie, MD  cholecalciferol (VITAMIN D3) 25 MCG (1000 UNIT) tablet Take 1,000 Units by mouth. Patient not taking: Reported on 09/23/2024 05/09/23   [provider]  diazepam (DIASTAT ACUDIAL) 10 MG  GEL Place 10 mg rectally once as needed for seizure (lasting more than 3 mins).  Patient not taking: Reported on 09/23/2024    [provider]  ibuprofen  (ADVIL ,MOTRIN ) 100 MG/5ML suspension Take 10.1 mLs (202 mg total) by mouth every 6 (six) hours as needed for fever or mild pain. Patient not taking: Reported on 09/23/2024 11/29/13   Rhae Lye, MD    Family History History reviewed. No pertinent family history.  Social History Social History   Tobacco Use   Smoking status: Never    Passive exposure: Never   Smokeless tobacco: Never  Vaping Use   Vaping status: Never Used  Substance Use Topics   Alcohol use: Never   Drug use: Never     Allergies   Patient has no allergy information on record.   Review of Systems Review of Systems  Constitutional:  Positive for fever.  HENT:  Positive for rhinorrhea.   Respiratory:  Positive for cough.      Physical Exam Triage Vital Signs ED Triage Vitals [09/23/24 1145]  Encounter Vitals Group     BP 104/68     Girls Systolic BP Percentile      Girls Diastolic BP Percentile      Boys Systolic BP Percentile      Boys Diastolic BP Percentile      Pulse Rate 77     Resp 18     Temp 97.8 F (36.6 C)     Temp Source Temporal     SpO2 96 %     Weight      Height      Head Circumference      Peak Flow      Pain Score      Pain Loc      Pain Education      Exclude from Growth Chart    No data found.  Updated Vital Signs BP 104/68 (BP Location: Right Arm) Comment (BP Location): lower arm  Pulse 77   Temp 97.8 F (36.6 C) (Temporal)   Resp 18   SpO2 96%   Visual Acuity Right Eye Distance:   Left Eye Distance:   Bilateral Distance:    Right Eye Near:   Left Eye Near:    Bilateral Near:     Physical Exam   UC Treatments / Results  Labs (all labs ordered are listed, but only abnormal results are displayed) Labs Reviewed - No data to display  EKG   Radiology No results  found.  Procedures Procedures (including critical care time)  Medications Ordered in UC Medications - No data to display  Initial Impression / Assessment and Plan / UC Course  I have reviewed the triage vital signs and the nursing notes.  Pertinent labs & imaging results that were available during my care of the patient were  reviewed by me and considered in my medical decision making (see chart for details).     *** Final Clinical Impressions(s) / UC Diagnoses   Final diagnoses:  Symptoms of upper respiratory infection (URI)   Discharge Instructions   None    ED Prescriptions   None    PDMP not reviewed this encounter.
